# Patient Record
Sex: Female | Born: 2005 | Race: Black or African American | Hispanic: No | Marital: Single | Smoking: Never smoker
Health system: Southern US, Community
[De-identification: ages and names within clinical notes are randomized; demographics above are authoritative.]

## PROBLEM LIST (undated history)

## (undated) DIAGNOSIS — H7290 Unspecified perforation of tympanic membrane, unspecified ear: Secondary | ICD-10-CM

## (undated) HISTORY — DX: Unspecified perforation of tympanic membrane, unspecified ear: H72.90

---
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
## 2024-03-12 ENCOUNTER — Encounter: Payer: Self-pay | Admitting: *Deleted

## 2024-04-29 ENCOUNTER — Other Ambulatory Visit: Payer: Self-pay

## 2024-04-29 DIAGNOSIS — L309 Dermatitis, unspecified: Secondary | ICD-10-CM

## 2024-04-29 MED ORDER — TRIAMCINOLONE ACETONIDE 0.5 % EX OINT
1.0000 | TOPICAL_OINTMENT | Freq: Two times a day (BID) | CUTANEOUS | 0 refills | Status: AC | PRN
Start: 2024-04-29 — End: ?
